# Patient Record
Sex: Female | Born: 1940 | Race: White | Hispanic: No | Marital: Married
Health system: Midwestern US, Community
[De-identification: ages and names within clinical notes are randomized; demographics above are authoritative.]

---

## 2008-05-16 LAB — METABOLIC PANEL, COMPREHENSIVE
A-G Ratio: 1.6 (ref 0.8–1.7)
ALT (SGPT): 40 U/L (ref 30–65)
AST (SGOT): 17 U/L (ref 15–37)
Albumin: 4.5 g/dL (ref 3.4–5.0)
Alk. phosphatase: 257 U/L — ABNORMAL HIGH (ref 50–136)
Anion gap: 12 mmol/L (ref 5–15)
BUN/Creatinine ratio: 17 (ref 12–20)
BUN: 10 MG/DL (ref 7–18)
Bilirubin, total: 0.3 MG/DL (ref 0.1–0.9)
CO2: 29 MMOL/L (ref 21–32)
Calcium: 10 MG/DL (ref 8.4–10.4)
Chloride: 101 MMOL/L (ref 100–108)
Creatinine: 0.6 MG/DL (ref 0.6–1.3)
GFR est AA: >60 mL/min/{1.73_m2} (ref >60)
GFR est non-AA: >60 mL/min/{1.73_m2} (ref >60)
Globulin: 2.9 g/dL (ref 2.0–4.0)
Glucose: 103 MG/DL — ABNORMAL HIGH (ref 74–99)
Potassium: 4.3 MMOL/L (ref 3.5–5.5)
Protein, total: 7.4 g/dL (ref 6.4–8.2)
Sodium: 142 MMOL/L (ref 136–145)

## 2008-05-16 LAB — LIPID PANEL
CHOL/HDL Ratio: 4.8 (ref 0–5.0)
Cholesterol, total: 264 MG/DL — ABNORMAL HIGH (ref 0–200)
HDL Cholesterol: 55 MG/DL (ref 40–60)
LDL, calculated: 160.8 MG/DL — ABNORMAL HIGH (ref 0–100)
LDL/HDL Ratio: 2.9
Triglyceride: 241 MG/DL — ABNORMAL HIGH (ref 0–150)
VLDL, calculated: 48.2 MG/DL

## 2008-05-16 LAB — PHENYTOIN: Phenytoin: 11.5 ug/mL (ref 10.0–20.0)

## 2008-05-17 LAB — VITAMIN D, 25 HYDROXY: Vitamin D 25-Hydroxy: 18 ng/mL — ABNORMAL LOW (ref 30–80)

## 2008-06-17 LAB — VITAMIN B12 & FOLATE
Folate: 18.5 ng/mL (ref 5.38–24.0)
Vitamin B12: 414 pg/mL (ref 211–911)

## 2008-06-17 LAB — PHENYTOIN: Phenytoin: 12.1 ug/mL (ref 10.0–20.0)

## 2016-04-14 ENCOUNTER — Inpatient Hospital Stay: Admit: 2016-04-14 | Payer: BLUE CROSS/BLUE SHIELD | Primary: Internal Medicine

## 2016-04-14 DIAGNOSIS — M549 Dorsalgia, unspecified: Secondary | ICD-10-CM

## 2016-04-14 NOTE — Progress Notes (Addendum)
[facility] ??? [facility] [place]  [address] [place], [place] [address] - Phone: [phone]  Fax: [phone]  PLAN OF CARE / STATEMENT OF MEDICAL NECESSITY FOR PHYSICAL THERAPY SERVICES  Patient Name: Hannah King DOB: [birth year]   Treatment   Diagnosis: Lumbago Medical   Diagnosis: Back pain [M54.9]   Onset Date: Chronic     Referral Source: Lurena Joiner, MD Start of Care [facility]): 04/14/2016   Prior Hospitalization: See medical history Provider #: [phone]   Prior Level of Function: Limited with ADLs and leisure activities secondary to chronic LBP   Comorbidities: Chronic Cough, Scoliosis, Anemia, CVA, Hyperlipidemia, GERD   Medications: Verified on Patient Summary List   The Plan of Care and following information is based on the information from the initial evaluation.   ==================================================================================  Assessment / key information:  Pt is a 76 yo female presents with hx of chronic LBP. Previous rx has included the following: Na. She presents with pain ranging from 5-10/10, located (R) side of lumbar spine.  Pain is made worse with activity, bending forward, prolonged positions, better with rest, laying down.  Pt has c/o radicular sxs in the (R) LE, described as numbness and weakness in LE.  L/S AROM: flexion 60 deg, extension 10 deg, LSB 10 deg, RSB 15 deg.  Palpation reveals TTP increase to (R) quadratus lumborum, longissimus,.  Posture increased thoracic kyphosis, decreased lumbar lordosis . Gait : forward flexed posture with use of rollator Sensation is intact to light touch.  MMT LE???s: hip flexion 4/5, extension 3+/5, abduction 3+/5, adduction 4/5, knee flexion 4/5, knee extension 4/5, ankle 4/5. Core stability is 2/5.  Repeated movement testing reveals no directional preference established, sxs consistent with lumbago. (-) Special tests include: SLR, Slump.  (+) Special tests include: PA  provocation at L5/S1, sacral base.  HS 90/90 (R) 25 deg, (L) 25 deg.  Pt will benefit from PT interventions to address the aforementioned deficits and allow pt to return to PLOF.   Eval Complexity: History HIGH Complexity :3+ comorbidities / personal factors will impact the outcome/ POC ;  Examination  MEDIUM Complexity : 3 Standardized tests and measures addressing body structure, function, activity limitation and / or participation in recreation ; Presentation MEDIUM Complexity : Evolving with changing characteristics ;  Decision Making MEDIUM Complexity : FOTO score of 26-74; Overall Complexity MEDIUM  ==================================================================================  Problem List: pain affecting function, decrease ROM, decrease strength, decrease ADL/ functional abilitiies, decrease activity tolerance, decrease flexibility/ joint mobility and decrease transfer abilities   Treatment Plan may include any combination of the following: Therapeutic exercise, Therapeutic activities, Neuromuscular re-education, Physical agent/modality, Gait/balance training, Manual therapy and Patient education  Patient / Family readiness to learn indicated by: asking questions, trying to perform skills and interest  Persons(s) to be included in education: patient (P)  Barriers to Learning/Limitations: no  Measures taken:    Patient Goal (s): Decrease pain and increase mobility   Patient self reported health status: fair  Rehabilitation Potential: fair  ? Short Term Goals: To be accomplished in  2weeks:  1. Pt will be independent and compliant with HEP to decrease pain, increase ROM and return pt to PLOF.    2. Pt will note pain less than or equal to 5/10 at worst to allow increase in functional abilities.   3. Pt will demonstrates increase in lumbar flexion AROM from 60 deg to 80 deg to aid in picking items  off floor  ? Long Term Goals: To be accomplished in  4  weeks:   1. Increase score on FOTO by > or = 10 to demo an increase in functional activity tolerance.   2. Pt will note < or = 2/10 pain with all mobility to improve comfort with ADL???s.   3. Pt will demonstrates GROC of >/= 4+ to allow increase in functional activity tolerance   Frequency / Duration:   Patient to be seen  2-3  times per week for 4  weeks:  Patient / Caregiver education and instruction: self care, activity modification and exercises  G-Codes (GP): na  Therapist Signature: Wenda LowMatthew Maziah Keeling DPT, CIMT Date: 04/14/2016   Certification Period: na Time: 10:52 AM   ===========================================================================================  I certify that the above Physical Therapy Services are being furnished while the patient is under my care.  I agree with the treatment plan and certify that this therapy is necessary.    Physician Signature:        Date:       Time:     Please sign and [facility] or you may fax the signed copy to [phone].  Thank you.

## 2016-04-14 NOTE — Progress Notes (Signed)
PHYSICAL THERAPY - DAILY TREATMENT NOTE    Patient Name: Hannah King        Date: 04/14/2016  DOB: 26-Feb-1941   YES Patient DOB Verified  Visit #:   1   of   12  Insurance: Payor: BLUE CROSS / Plan: [employer] / Product Type: HMO /      In time: 1025 Out time: 1050   Total Treatment Time: 25     TREATMENT AREA =  Lumbago    SUBJECTIVE  Pain Level (on 0 to 10 scale):  ie  / 10   Medication Changes/New allergies or changes in medical history, any new surgeries or procedures?    NO    If yes, update Summary List   Subjective Functional Status/Changes:    No changes reported     See POC          OBJECTIVE  Modality rationale:      min  Estim, type:                                            att       unatt       w/US       w/ice      w/heat    min   Mechanical Traction: type/lbs                                                 pro     sup     int     cont    min   Ultrasound, settings/location:      min   Iontophoresis:    take home patch w/ dexamethazone    min                                  in clinic w/ dexamethazone    min   Ice       Heat     position:     min   Other:       min Manual Therapy:       min Therapeutic Exercise:    See flow sheet     Other:        Added:     to improve (function):      Changed:     to improve (function):      8 min Patient Education:  YES  Reviewed HEP     Progressed/Changed HEP based on:   Reviewed rationale for treatment using anatomical charts, HEP for lumbar mobility and flexibility      Other Objective/Functional Measures:    See POC     Post Treatment Pain Level (on 0 to 10) scale:   ie  / 10     ASSESSMENT    See Progress Note/Recertification   Patient will continue to benefit from skilled therapy to address remaining functional deficits: Decreased positional tolerance, ADL tolerance    Progress toward goals / Updated goals:    See POC     PLAN    Upgrade activities as tolerated YES Continue plan of care     Discharge due to :  Other:       Therapist: Wenda LowMatthew Haydyn Girvan DPT, CIMT    Date: 04/14/2016 Time: 10:50 AM

## 2016-04-18 ENCOUNTER — Inpatient Hospital Stay
Admit: 2016-04-18 | Payer: BLUE CROSS/BLUE SHIELD | Attending: Rehabilitative and Restorative Service Providers" | Primary: Internal Medicine

## 2016-04-18 NOTE — Progress Notes (Signed)
PHYSICAL THERAPY - DAILY TREATMENT NOTE    Patient Name: Hannah King        Date: 04/18/2016  DOB: 04/14/1940   YES Patient DOB Verified  Visit #:   2   of   12  Insurance: Payor: BLUE CROSS / Plan: [employer] / Product Type: HMO /      In time: 1100 Out time: 1200   Total Treatment Time: 60     TREATMENT AREA =  Back pain [M54.9]    SUBJECTIVE  Pain Level (on 0 to 10 scale):  8  / 10   Medication Changes/New allergies or changes in medical history, any new surgeries or procedures?    NO    If yes, update Summary List   Subjective Functional Status/Changes:    No changes reported     Functional improvements:none, but tries to perform HEP.  Functional impairments: lumbar pain limiting activities.        OBJECTIVE  Modalities Rationale:     decrease pain and increase tissue extensibility to improve patient's ability to  improve patient's ability to perform ADL's with decreased pain     min  Estim, type/location:                                        att       unatt       w/US       w/ice      w/heat    min   Mechanical Traction: type/lbs                     pro     sup     int     cont      before manual      after manual    min   Ultrasound, settings/location:      min   Iontophoresis w/ dexamethasone, location:                                                 take home patch         in clinic   10 min   Ice       Heat    location/position: Supine to full back    min   Vasopneumatic Device, press/temp:     min   Other:     Skin assessment post-treatment (if applicable):      intact      redness- no adverse reaction     redness ??? adverse reaction:        15 min Manual Therapy: Technique:       S/DTM IASTM PROM  Passive Stretching   manual TPR    Jt manipulation:Gr I  II   III  IV V  Treatment Area:  PA mobs T10-L5 very gently grade 1 to most and grade 2 to others.  STM to QL, LS paraspinals   Rationale:      decrease pain, increase ROM and increase tissue  extensibility to improve patient's ability to  improve patient's ability to perform ADL's with decreased pain      35 min Therapeutic Exercise:    See flow sheet   Rationale:  increase ROM and increase strength to improve the patient???s ability to  improve patient's ability to perform ADL's with decreased pain         throughout therapy min Patient Education:  YES  Reviewed HEP     Progressed/Changed HEP based on:        Other Objective/Functional Measures:    Stiffness throughout B LE's L>R.  Very tender to touch T11, 12, L 1, L3  Corrected HEP technique.     Post Treatment Pain Level (on 0 to 10) scale:   0  / 10     ASSESSMENT  Assessment/Changes in Function:     The pt is very tender to most of lower thoracic and lumbar spine. Quads, HS, hip ER all very stiff and limited likely contributing to ongoing pain/compression to spine.       See Progress Note/Recertification   Patient will continue to benefit from skilled PT services to modify and progress therapeutic interventions, address functional mobility deficits, address ROM deficits, address strength deficits, analyze and address soft tissue restrictions, analyze and cue movement patterns, analyze and modify body mechanics/ergonomics and assess and modify postural abnormalities to attain remaining goals.   Progress toward goals / Updated goals:    Improving with HEP technique     PLAN    Upgrade activities as tolerated YES Continue plan of care     Discharge due to :      Other:      Therapist: Ihor Dow, PT    Date: 04/18/2016 Time: 11:21 AM     Future Appointments  Date Time Provider Department Center   04/21/2016 10:30 AM Marliss Coots, PT [facility]   04/25/2016 12:00 PM Ihor Dow, PT [facility]   04/28/2016 10:00 AM Marliss Coots, PT [facility]   05/02/2016 11:30 AM Ihor Dow, PT [facility]   05/05/2016 10:30 AM Marliss Coots, PT [facility]   05/09/2016 11:00 AM Ihor Dow, PT [facility]    05/12/2016 10:30 AM Marliss Coots, PT [facility]

## 2016-04-21 ENCOUNTER — Inpatient Hospital Stay: Admit: 2016-04-21 | Payer: BLUE CROSS/BLUE SHIELD | Primary: Internal Medicine

## 2016-04-21 NOTE — Progress Notes (Signed)
PHYSICAL THERAPY - DAILY TREATMENT NOTE  8-14    Patient Name: Hannah King        Date: 04/21/2016  DOB: 08-30-1940   YES Patient DOB Verified  Visit #:   3   of   12  Insurance: Payor: BLUE CROSS / Plan: [employer] / Product Type: HMO /      In time: 1030 Out time: 1115   Total Treatment Time: 45     Medicare Time Tracking (below)   Total Timed Codes (min):   1:1 Treatment Time:       TREATMENT AREA =  Back pain [M54.9]    SUBJECTIVE    Pain Level (on 0 to 10 scale):  0  / 10   Medication Changes/New allergies or changes in medical history, any new surgeries or procedures?    NO    If yes, update Summary List   Subjective Functional Status/Changes:    No changes reported     Reports no pain at onset of visit today, states she felt better after last visit      OBJECTIVE  Modalities Rationale:     decrease inflammation and decrease pain to improve patient's ability to decrease pain and increase functional activities tolerance       min  Estim, type/location:                                        att       unatt       w/US       w/ice      w/heat    min   Mechanical Traction: type/lbs                     pro     sup     int     cont      before manual      after manual    min   Ultrasound, settings/location:      min   Iontophoresis w/ dexamethasone, location:                                                 take home patch         in clinic   10 min   Ice       Heat    location/position: Lx spine    min   Vasopneumatic Device, press/temp:     min   Other:     Skin assessment post-treatment (if applicable):      intact      redness- no adverse reaction     redness ??? adverse reaction:      25 min Therapeutic Exercise:    See flow sheet   Rationale:      increase ROM and increase strength to improve the patient???s ability to perform ADLs      10 min Manual Therapy: Technique:       S/DTM IASTM PROM  Passive Stretching   manual TPR    Jt manipulation:Gr I  II   III  IV V   Treatment Area:  DTM to (L) QL, lumbar paraspinals, ((R)) iliopsoas release, PA mobs to T10-L5 and (L) sacral base  Rationale:      decrease pain, increase ROM, increase tissue extensibility and decrease trigger points to improve patient's ability to increase positional tolerance        min Gait Training:    Rationale:        throughout therapy min Patient Education:  YES  Reviewed HEP     Progressed/Changed HEP based on:        Other Objective/Functional Measures:    Reports soreness/pain with several exercises today, modified to increase comfort. Demonstrates increased hypertonicity in (R) iliopsoas, (R) > (L) QL and lumbar paraspinals, along spinous processes of T10-L5     Post Treatment Pain Level (on 0 to 10) scale:   0  / 10     ASSESSMENT    Assessment/Changes in Function:     Con't with decreased pain with use of therx and heat after treatment session. Pt advised to con't with HEP and make attempts to complete on a more consistent basis        See Progress Note/Recertification   Patient will continue to benefit from skilled PT services to modify and progress therapeutic interventions, address functional mobility deficits, address ROM deficits, address strength deficits and analyze and address soft tissue restrictions to attain remaining goals.      Progress toward goals / Updated goals:    Progressing with overall function and ADL tolerance      PLAN      Upgrade activities as tolerated YES Continue plan of care     Discharge due to :      Other:      Therapist: Marliss Coots, PT    Date: 04/21/2016 Time: 11:08 AM   Future Appointments  Date Time Provider Department Center   04/25/2016 12:00 PM Lenoard Aden Vidrine, PT [facility]   04/28/2016 10:00 AM Marliss Coots, PT [facility]   05/02/2016 11:30 AM Ihor Dow, PT [facility]   05/05/2016 10:30 AM Marliss Coots, PT [facility]   05/09/2016 11:00 AM Ihor Dow, PT [facility]   05/12/2016 10:30 AM Marliss Coots, PT [facility]

## 2016-04-21 NOTE — Progress Notes (Signed)
[facility] ??? [facility] [place]  [address], [place], [place] [id number] - Phone: [phone]  Fax: [phone]  DISCHARGE NOTE  Patient Name: Hannah PatellaMarie Gingras DOB: 26-May-1940   Medical/Treatment Diagnosis: Back pain [M54.9]   Referral Source: Derber, Andres ShadPaul M, MD     Date of Initial Visit: 04/14/16 Attended Visits: 3 Missed Visits: 5     SUMMARY OF TREATMENT  Ther ex including strengthening, ROM, flexibility, stabilization; manual therapy including: Lx mobs, sacral mobs Patient education; HEP consisting of Lx mobility and core stab exercises   CURRENT STATUS  Hannah King has made no progress in therapy. Pt was seen for 3 visit prior to self DC from PT services.     ?? Short Term Goals: To be accomplished in  2weeks:  1. Pt will be independent and compliant with HEP to decrease pain, increase ROM and return pt to PLOF.    2. Pt will note pain less than or equal to 5/10 at worst to allow increase in functional abilities.   3. Pt will demonstrates increase in lumbar flexion AROM from 60 deg to 80 deg to aid in picking items off floor  ?? Long Term Goals: To be accomplished in  4  weeks:  1. Increase score on FOTO by > or = 10 to demo an increase in functional activity tolerance.   2. Pt will note < or = 2/10 pain with all mobility to improve comfort with ADL???s.   3. Pt will demonstrates GROC of >/= 4+ to allow increase in functional activity tolerance     RECOMMENDATIONS  Unable to determine progress in PT services due to limit participation in PT services. Will DC at this time.   Specifics:  Pt will be DC'd at this time and instructed to f/u with your office as needed. Thank you.     If you have any questions/comments please contact us directly at [phone].   Thank you for allowing us to assist in the care of your patient.    Therapist Signature: Wenda LowMatthew Liviah Cake DPT, CIMT Date: 06/02/2016     Time: 8:49 AM

## 2016-04-25 ENCOUNTER — Encounter
Payer: BLUE CROSS/BLUE SHIELD | Attending: Rehabilitative and Restorative Service Providers" | Primary: Internal Medicine

## 2016-04-28 ENCOUNTER — Inpatient Hospital Stay: Payer: BLUE CROSS/BLUE SHIELD | Primary: Internal Medicine

## 2016-05-02 ENCOUNTER — Encounter
Payer: BLUE CROSS/BLUE SHIELD | Attending: Rehabilitative and Restorative Service Providers" | Primary: Internal Medicine

## 2016-05-05 ENCOUNTER — Encounter: Payer: BLUE CROSS/BLUE SHIELD | Primary: Internal Medicine

## 2016-05-09 ENCOUNTER — Encounter
Payer: BLUE CROSS/BLUE SHIELD | Attending: Rehabilitative and Restorative Service Providers" | Primary: Internal Medicine

## 2016-05-12 ENCOUNTER — Encounter: Payer: BLUE CROSS/BLUE SHIELD | Primary: Internal Medicine
